# Patient Record
Sex: Female | Born: 1945 | Race: White | Hispanic: No | Smoking: Never smoker
Health system: Southern US, Community
[De-identification: ages and names within clinical notes are randomized; demographics above are authoritative.]

## PROBLEM LIST (undated history)

## (undated) DIAGNOSIS — E7800 Pure hypercholesterolemia, unspecified: Secondary | ICD-10-CM

## (undated) DIAGNOSIS — J45909 Unspecified asthma, uncomplicated: Secondary | ICD-10-CM

## (undated) HISTORY — PX: TONSILLECTOMY: SUR1361

## (undated) HISTORY — PX: APPENDECTOMY: SHX54

---

## 2016-05-15 ENCOUNTER — Emergency Department (HOSPITAL_COMMUNITY): Payer: Medicare (Managed Care)

## 2016-05-15 ENCOUNTER — Encounter (HOSPITAL_COMMUNITY): Payer: Self-pay | Admitting: Emergency Medicine

## 2016-05-15 ENCOUNTER — Observation Stay (HOSPITAL_COMMUNITY)
Admission: EM | Admit: 2016-05-15 | Discharge: 2016-05-16 | Disposition: A | Discharge status: Home / Self Care | Payer: Medicare (Managed Care) | Attending: Cardiology | Admitting: Cardiology

## 2016-05-15 DIAGNOSIS — I252 Old myocardial infarction: Secondary | ICD-10-CM | POA: Insufficient documentation

## 2016-05-15 DIAGNOSIS — I200 Unstable angina: Secondary | ICD-10-CM | POA: Diagnosis present

## 2016-05-15 DIAGNOSIS — Z8673 Personal history of transient ischemic attack (TIA), and cerebral infarction without residual deficits: Secondary | ICD-10-CM | POA: Diagnosis not present

## 2016-05-15 DIAGNOSIS — K219 Gastro-esophageal reflux disease without esophagitis: Secondary | ICD-10-CM | POA: Diagnosis not present

## 2016-05-15 DIAGNOSIS — I25110 Atherosclerotic heart disease of native coronary artery with unstable angina pectoris: Secondary | ICD-10-CM | POA: Insufficient documentation

## 2016-05-15 DIAGNOSIS — I10 Essential (primary) hypertension: Secondary | ICD-10-CM | POA: Insufficient documentation

## 2016-05-15 DIAGNOSIS — Z8249 Family history of ischemic heart disease and other diseases of the circulatory system: Secondary | ICD-10-CM | POA: Insufficient documentation

## 2016-05-15 DIAGNOSIS — E785 Hyperlipidemia, unspecified: Secondary | ICD-10-CM | POA: Insufficient documentation

## 2016-05-15 DIAGNOSIS — R030 Elevated blood-pressure reading, without diagnosis of hypertension: Secondary | ICD-10-CM | POA: Diagnosis present

## 2016-05-15 DIAGNOSIS — J449 Chronic obstructive pulmonary disease, unspecified: Secondary | ICD-10-CM | POA: Diagnosis not present

## 2016-05-15 DIAGNOSIS — J4530 Mild persistent asthma, uncomplicated: Principal | ICD-10-CM | POA: Diagnosis present

## 2016-05-15 DIAGNOSIS — Z7982 Long term (current) use of aspirin: Secondary | ICD-10-CM | POA: Insufficient documentation

## 2016-05-15 DIAGNOSIS — G4733 Obstructive sleep apnea (adult) (pediatric): Secondary | ICD-10-CM | POA: Insufficient documentation

## 2016-05-15 DIAGNOSIS — R079 Chest pain, unspecified: Secondary | ICD-10-CM

## 2016-05-15 HISTORY — DX: Pure hypercholesterolemia, unspecified: E78.00

## 2016-05-15 HISTORY — DX: Unspecified asthma, uncomplicated: J45.909

## 2016-05-15 LAB — COMPREHENSIVE METABOLIC PANEL
ALBUMIN: 3.9 g/dL (ref 3.5–5.0)
ALT: 16 U/L (ref 14–54)
AST: 20 U/L (ref 15–41)
Alkaline Phosphatase: 50 U/L (ref 38–126)
Anion gap: 10 (ref 5–15)
BUN: 20 mg/dL (ref 6–20)
CHLORIDE: 107 mmol/L (ref 101–111)
CO2: 24 mmol/L (ref 22–32)
Calcium: 9.4 mg/dL (ref 8.9–10.3)
Creatinine, Ser: 0.82 mg/dL (ref 0.44–1.00)
GFR calc Af Amer: >60 mL/min (ref >60)
GLUCOSE: 112 mg/dL — AB (ref 65–99)
POTASSIUM: 3.4 mmol/L — AB (ref 3.5–5.1)
SODIUM: 141 mmol/L (ref 135–145)
Total Bilirubin: 0.7 mg/dL (ref 0.3–1.2)
Total Protein: 6.7 g/dL (ref 6.5–8.1)

## 2016-05-15 LAB — CBC WITH DIFFERENTIAL/PLATELET
BASOS ABS: 0 10*3/uL (ref 0.0–0.1)
BASOS PCT: 0 %
EOS ABS: 0.5 10*3/uL (ref 0.0–0.7)
EOS PCT: 6 %
HCT: 37.8 % (ref 36.0–46.0)
Hemoglobin: 12.5 g/dL (ref 12.0–15.0)
Lymphocytes Relative: 29 %
Lymphs Abs: 2.4 10*3/uL (ref 0.7–4.0)
MCH: 30.1 pg (ref 26.0–34.0)
MCHC: 33.1 g/dL (ref 30.0–36.0)
MCV: 91.1 fL (ref 78.0–100.0)
MONO ABS: 0.3 10*3/uL (ref 0.1–1.0)
Monocytes Relative: 4 %
Neutro Abs: 5.1 10*3/uL (ref 1.7–7.7)
Neutrophils Relative %: 61 %
PLATELETS: 298 10*3/uL (ref 150–400)
RBC: 4.15 MIL/uL (ref 3.87–5.11)
RDW: 13.1 % (ref 11.5–15.5)
WBC: 8.3 10*3/uL (ref 4.0–10.5)

## 2016-05-15 LAB — I-STAT TROPONIN, ED: TROPONIN I, POC: 0 ng/mL (ref 0.00–0.08)

## 2016-05-15 MED ORDER — ASPIRIN 325 MG PO TABS
325.0000 mg | ORAL_TABLET | Freq: Once | ORAL | Status: AC
  Administered 2016-05-16: 325 mg via ORAL
  Filled 2016-05-15: qty 1

## 2016-05-15 NOTE — ED Triage Notes (Signed)
Pt c/o right sided chest pressure off and on x's 2 weeks.  Also st's she feels short of breath.  Pt st's she has a hx of asthma

## 2016-05-15 NOTE — ED Provider Notes (Signed)
[facility]-EMERGENCY DEPT Provider Note   CSN: [id number] Arrival date & time: 05/15/16  1916  By signing my name below, I, Arianna Nassar, attest that this documentation has been prepared under the direction and in the presence of Azalia Bilis, MD.  Electronically Signed: Octavia Heir, ED Scribe. 05/16/16. 12:02 AM.    History   Chief Complaint Chief Complaint  Patient presents with  . Chest Pain   The history is provided by the patient. No language interpreter was used.   HPI Comments: Stephanie Hanna is a 70 y.o. female who has a PMhx of high cholesterol and asthma presents to the Emergency Department complaining of intermittent, gradual worsening, moderate right sided chest pressure with associated palpitations upon exertion onset three nights ago. She further notes having issues with her asthma. Pt says that she was exercising for ~ 10 minutes and notes that she felt sudden chest pressure and notes it was hard to catch her breath. She reports being seen for the same symptoms ~ ten months ago where she was able to exercise with no problem. Pt says she has taken her nebulizer inhaler to alleviate her symptoms with minimal relief for one night but has not caused relief since. She notes her chest pain modifies with rest. Pt mentions about 4 months ago, she began to feel slightly short of breath when going up the stairs which is abnormal. She notes calling her PCP in [place] and was told to come and be evaluated in the ED. Pt notes having 4% plaque on her heart. Pt has a paternal hx of MI x 4 in his 25's, CAD, CVA, COPD, emphysema, asthma, and HTN due to being a smoker. Pt has had one stress test last year which was normal. Pt is a non-smoker. She denies hx of DM, HTN, or heart catheterization.      Past Medical History:  Diagnosis Date  . Asthma   . High cholesterol     There are no active problems to display for this patient.   Past Surgical History:  Procedure Laterality Date    . APPENDECTOMY    . TONSILLECTOMY      OB History    No data available       Home Medications    Prior to Admission medications   Not on File    Family History No family history on file.  Social History Social History  Substance Use Topics  . Smoking status: Never Smoker  . Smokeless tobacco: Never Used  . Alcohol use No     Allergies   Review of patient's allergies indicates not on file.   Review of Systems Review of Systems  A complete 10 system review of systems was obtained and all systems are negative except as noted in the HPI and PMH.   Physical Exam Updated Vital Signs BP 108/67   Pulse 67   Temp 97.6 F (36.4 C) (Oral)   Resp 16   Ht 5\' 2"  (1.575 m)   Wt 120 lb (54.4 kg)   SpO2 99%   BMI 21.95 kg/m   Physical Exam  Constitutional: She is oriented to person, place, and time. She appears well-developed and well-nourished. No distress.  HENT:  Head: Normocephalic and atraumatic.  Eyes: EOM are normal.  Neck: Normal range of motion.  Cardiovascular: Normal rate, regular rhythm and normal heart sounds.   Pulmonary/Chest: Effort normal and breath sounds normal.  Abdominal: Soft. She exhibits no distension. There is no tenderness.  Musculoskeletal: Normal  range of motion.  Neurological: She is alert and oriented to person, place, and time.  Skin: Skin is warm and dry.  Psychiatric: She has a normal mood and affect. Judgment normal.  Nursing note and vitals reviewed.    ED Treatments / Results  DIAGNOSTIC STUDIES: Oxygen Saturation is 99% on RA, normal by my interpretation.  COORDINATION OF CARE:  11:59 PM Discussed treatment with pt at bedside and pt agreed to plan.  Labs (all labs ordered are listed, but only abnormal results are displayed) Labs Reviewed  COMPREHENSIVE METABOLIC PANEL - Abnormal; Notable for the following:       Result Value   Potassium 3.4 (*)    Glucose, Bld 112 (*)    All other components within normal limits   CBC WITH DIFFERENTIAL/PLATELET  TROPONIN I  BRAIN NATRIURETIC PEPTIDE  TSH  TROPONIN I  COMPREHENSIVE METABOLIC PANEL  CBC  PROTIME-INR  HEPARIN LEVEL (UNFRACTIONATED)  TROPONIN I  TROPONIN I  HEMOGLOBIN A1C  I-STAT TROPOININ, ED    EKG  EKG Interpretation  Date/Time:  Friday May 15 2016 19:48:35 EDT Ventricular Rate:  81 PR Interval:  198 QRS Duration: 82 QT Interval:  386 QTC Calculation: 448 R Axis:   -103 Text Interpretation:  Normal sinus rhythm Right superior axis deviation Right ventricular hypertrophy Abnormal ECG No significant change was found Confirmed by Briah Nary  MD, Suliman Termini ([id number]) on 05/15/2016 11:06:05 PM       Radiology Dg Chest 2 View  Result Date: 05/15/2016 CLINICAL DATA:  Chest pressure EXAM: CHEST  2 VIEW COMPARISON:  None. FINDINGS: The heart size and mediastinal contours are within normal limits. Both lungs are clear. The visualized skeletal structures are unremarkable. IMPRESSION: No active cardiopulmonary disease. Electronically Signed   By: Natasha MeadLiviu  Pop M.D.   On: 05/15/2016 20:25    Procedures Procedures (including critical care time)  Medications Ordered in ED Medications  cromolyn (OPTICROM) 4 % ophthalmic solution 1 drop (not administered)  levothyroxine (SYNTHROID, LEVOTHROID) tablet 50 mcg (not administered)  albuterol (PROVENTIL) (2.5 MG/3ML) 0.083% nebulizer solution 2.5 mg (not administered)  loratadine (CLARITIN) tablet 10 mg (not administered)  ipratropium (ATROVENT) 0.06 % nasal spray 2 spray (not administered)  cholecalciferol (VITAMIN D) tablet 4,000 Units (not administered)  calcium carbonate (TUMS - dosed in mg elemental calcium) chewable tablet 400 mg of elemental calcium (not administered)  montelukast (SINGULAIR) tablet 10 mg (not administered)  sodium chloride flush (NS) 0.9 % injection 3 mL (3 mLs Intravenous Given 05/16/16 0331)  aspirin EC tablet 81 mg (not administered)  heparin ADULT infusion 100 units/mL (25000  units/24250mL sodium chloride 0.45%) (650 Units/hr Intravenous New Bag/Given 05/16/16 0326)  metoprolol succinate (TOPROL-XL) 24 hr tablet 25 mg (not administered)  aspirin tablet 325 mg (325 mg Oral Given 05/16/16 0024)  heparin bolus via infusion 2,000 Units (2,000 Units Intravenous Bolus from Bag 05/16/16 0330)     Initial Impression / Assessment and Plan / ED Course  I have reviewed the triage vital signs and the nursing notes.  Pertinent labs & imaging results that were available during my care of the patient were reviewed by me and considered in my medical decision making (see chart for details).  Clinical Course    Start concerning for unstable angina with no exertional chest discomfort and chest pressure.  Cardiology consultation for admission the hospital.  No active chest pain at this time.  EKG and troponin are without abnormality  Final Clinical Impressions(s) / ED Diagnoses   Final diagnoses:  None   I personally performed the services described in this documentation, which was scribed in my presence. The recorded information has been reviewed and is accurate.     New Prescriptions New Prescriptions   No medications on file     Azalia Bilis, MD 05/16/16 [phone]

## 2016-05-16 ENCOUNTER — Other Ambulatory Visit: Payer: Self-pay | Admitting: Student

## 2016-05-16 DIAGNOSIS — R079 Chest pain, unspecified: Secondary | ICD-10-CM

## 2016-05-16 DIAGNOSIS — J4530 Mild persistent asthma, uncomplicated: Secondary | ICD-10-CM | POA: Diagnosis not present

## 2016-05-16 DIAGNOSIS — I25110 Atherosclerotic heart disease of native coronary artery with unstable angina pectoris: Secondary | ICD-10-CM | POA: Diagnosis not present

## 2016-05-16 DIAGNOSIS — R030 Elevated blood-pressure reading, without diagnosis of hypertension: Secondary | ICD-10-CM | POA: Diagnosis present

## 2016-05-16 DIAGNOSIS — R0609 Other forms of dyspnea: Secondary | ICD-10-CM

## 2016-05-16 DIAGNOSIS — I200 Unstable angina: Secondary | ICD-10-CM

## 2016-05-16 DIAGNOSIS — K219 Gastro-esophageal reflux disease without esophagitis: Secondary | ICD-10-CM | POA: Diagnosis not present

## 2016-05-16 DIAGNOSIS — G4733 Obstructive sleep apnea (adult) (pediatric): Secondary | ICD-10-CM | POA: Diagnosis not present

## 2016-05-16 LAB — CBC
HCT: 39.7 % (ref 36.0–46.0)
HEMOGLOBIN: 12.8 g/dL (ref 12.0–15.0)
MCH: 29.8 pg (ref 26.0–34.0)
MCHC: 32.2 g/dL (ref 30.0–36.0)
MCV: 92.5 fL (ref 78.0–100.0)
Platelets: 287 10*3/uL (ref 150–400)
RBC: 4.29 MIL/uL (ref 3.87–5.11)
RDW: 13 % (ref 11.5–15.5)
WBC: 8.7 10*3/uL (ref 4.0–10.5)

## 2016-05-16 LAB — COMPREHENSIVE METABOLIC PANEL
ALBUMIN: 3.9 g/dL (ref 3.5–5.0)
ALT: 16 U/L (ref 14–54)
ANION GAP: 7 (ref 5–15)
AST: 18 U/L (ref 15–41)
Alkaline Phosphatase: 52 U/L (ref 38–126)
BUN: 20 mg/dL (ref 6–20)
CO2: 28 mmol/L (ref 22–32)
Calcium: 9.2 mg/dL (ref 8.9–10.3)
Chloride: 106 mmol/L (ref 101–111)
Creatinine, Ser: 0.69 mg/dL (ref 0.44–1.00)
GFR calc non Af Amer: >60 mL/min (ref >60)
GLUCOSE: 98 mg/dL (ref 65–99)
POTASSIUM: 3.5 mmol/L (ref 3.5–5.1)
SODIUM: 141 mmol/L (ref 135–145)
Total Bilirubin: 0.5 mg/dL (ref 0.3–1.2)
Total Protein: 7 g/dL (ref 6.5–8.1)

## 2016-05-16 LAB — TROPONIN I
Troponin I: <0.03 ng/mL (ref <0.03)
Troponin I: <0.03 ng/mL (ref <0.03)

## 2016-05-16 LAB — PROTIME-INR
INR: 0.91
Prothrombin Time: 12.3 seconds (ref 11.4–15.2)

## 2016-05-16 LAB — GLUCOSE, CAPILLARY: Glucose-Capillary: 103 mg/dL — ABNORMAL HIGH (ref 65–99)

## 2016-05-16 LAB — TSH: TSH: 2.717 u[IU]/mL (ref 0.350–4.500)

## 2016-05-16 LAB — BRAIN NATRIURETIC PEPTIDE: B Natriuretic Peptide: 26.5 pg/mL (ref 0.0–100.0)

## 2016-05-16 LAB — HEPARIN LEVEL (UNFRACTIONATED): Heparin Unfractionated: 0.56 IU/mL (ref 0.30–0.70)

## 2016-05-16 MED ORDER — VITAMIN D 1000 UNITS PO TABS
4000.0000 [IU] | ORAL_TABLET | Freq: Every day | ORAL | Status: DC
  Administered 2016-05-16: 4000 [IU] via ORAL
  Filled 2016-05-16: qty 4

## 2016-05-16 MED ORDER — CROMOLYN SODIUM 4 % OP SOLN
1.0000 [drp] | Freq: Four times a day (QID) | OPHTHALMIC | Status: DC
  Administered 2016-05-16 (×2): 1 [drp] via OPHTHALMIC
  Filled 2016-05-16: qty 10

## 2016-05-16 MED ORDER — LORATADINE 10 MG PO TABS
10.0000 mg | ORAL_TABLET | Freq: Every day | ORAL | Status: DC
  Administered 2016-05-16: 10 mg via ORAL
  Filled 2016-05-16: qty 1

## 2016-05-16 MED ORDER — ALBUTEROL SULFATE (2.5 MG/3ML) 0.083% IN NEBU: 2.5000 mg | INHALATION_SOLUTION | Freq: Four times a day (QID) | RESPIRATORY_TRACT | Status: DC | PRN

## 2016-05-16 MED ORDER — ASPIRIN EC 81 MG PO TBEC
81.0000 mg | DELAYED_RELEASE_TABLET | Freq: Every day | ORAL | Status: DC
  Administered 2016-05-16: 81 mg via ORAL
  Filled 2016-05-16: qty 1

## 2016-05-16 MED ORDER — ASPIRIN 81 MG PO TBEC: 81.0000 mg | DELAYED_RELEASE_TABLET | Freq: Every day | ORAL | Status: AC

## 2016-05-16 MED ORDER — HEPARIN BOLUS VIA INFUSION
2000.0000 [IU] | Freq: Once | INTRAVENOUS | Status: AC
  Administered 2016-05-16: 2000 [IU] via INTRAVENOUS
  Filled 2016-05-16: qty 2000

## 2016-05-16 MED ORDER — ALBUTEROL SULFATE HFA 108 (90 BASE) MCG/ACT IN AERS: 1.0000 | INHALATION_SPRAY | Freq: Four times a day (QID) | RESPIRATORY_TRACT | Status: DC | PRN

## 2016-05-16 MED ORDER — LEVOTHYROXINE SODIUM 50 MCG PO TABS: 50.0000 ug | ORAL_TABLET | Freq: Every day | ORAL | Status: DC

## 2016-05-16 MED ORDER — METOPROLOL SUCCINATE ER 25 MG PO TB24
25.0000 mg | ORAL_TABLET | Freq: Every day | ORAL | Status: DC
  Filled 2016-05-16: qty 1

## 2016-05-16 MED ORDER — SODIUM CHLORIDE 0.9% FLUSH
3.0000 mL | Freq: Two times a day (BID) | INTRAVENOUS | Status: DC
  Administered 2016-05-16: 3 mL via INTRAVENOUS

## 2016-05-16 MED ORDER — MONTELUKAST SODIUM 10 MG PO TABS: 10.0000 mg | ORAL_TABLET | Freq: Every day | ORAL | Status: DC

## 2016-05-16 MED ORDER — IPRATROPIUM BROMIDE 0.06 % NA SOLN
2.0000 | Freq: Three times a day (TID) | NASAL | Status: DC
  Administered 2016-05-16: 2 via NASAL
  Filled 2016-05-16: qty 30

## 2016-05-16 MED ORDER — CALCIUM CARBONATE ANTACID 500 MG PO CHEW
2.0000 | CHEWABLE_TABLET | Freq: Every day | ORAL | Status: DC
  Administered 2016-05-16: 400 mg via ORAL
  Filled 2016-05-16: qty 2

## 2016-05-16 MED ORDER — HEPARIN (PORCINE) IN NACL 100-0.45 UNIT/ML-% IJ SOLN
650.0000 [IU]/h | INTRAMUSCULAR | Status: DC
  Administered 2016-05-16: 650 [IU]/h via INTRAVENOUS
  Filled 2016-05-16 (×2): qty 250

## 2016-05-16 NOTE — Progress Notes (Signed)
Patient ID: Myeasha Ballowe, female   DOB: [birth year], 70 y.o.   MRN: [id number]    Patient Name: Stephanie Hanna Date of Encounter: 05/16/2016     Principal Problem:   Mild persistent asthma Active Problems:   Unstable angina (HCC)   Prehypertension   GERD (gastroesophageal reflux disease)    SUBJECTIVE  No chest pressure or sob. Ruled out for MI. ECG is normal.  CURRENT MEDS . aspirin EC  81 mg Oral Daily  . calcium carbonate  2 tablet Oral Daily  . cholecalciferol  4,000 Units Oral Daily  . cromolyn  1 drop Both Eyes QID  . ipratropium  2 spray Each Nare TID  . levothyroxine  50 mcg Oral QAC breakfast  . loratadine  10 mg Oral Daily  . metoprolol succinate  25 mg Oral Daily  . montelukast  10 mg Oral QHS  . sodium chloride flush  3 mL Intravenous Q12H    OBJECTIVE  Vitals:   05/16/16 0130 05/16/16 0312 05/16/16 1000 05/16/16 1100  BP: 124/57 135/67 (!) 99/47 (!) 99/47  Pulse: (!) 57 63 75 75  Resp: 12 18    Temp:  97.6 F (36.4 C)    TempSrc:  Oral    SpO2: 100%     Weight:  120 lb 4.8 oz (54.6 kg)    Height:  5\' 2"  (1.575 m)      Intake/Output Summary (Last 24 hours) at 05/16/16 1120 Last data filed at 05/16/16 0800  Gross per 24 hour  Intake              240 ml  Output                0 ml  Net              240 ml   Filed Weights   05/15/16 1949 05/16/16 0312  Weight: 120 lb (54.4 kg) 120 lb 4.8 oz (54.6 kg)    PHYSICAL EXAM  General: Pleasant, NAD. Neuro: Alert and oriented X 3. Moves all extremities spontaneously. Psych: Normal affect. HEENT:  Normal  Neck: Supple without bruits or JVD. Lungs:  Resp regular and unlabored, CTA. Heart: RRR no s3, s4, or murmurs. Abdomen: Soft, non-tender, non-distended, BS + x 4.  Extremities: No clubbing, cyanosis or edema. DP/PT/Radials 2+ and equal bilaterally.  Accessory Clinical Findings  CBC  Recent Labs  05/15/16 1953 05/16/16 0320  WBC 8.3 8.7  NEUTROABS 5.1  --   HGB 12.5 12.8  HCT  37.8 39.7  MCV 91.1 92.5  PLT 298 287   Basic Metabolic Panel  Recent Labs  05/15/16 1953 05/16/16 0320  NA 141 141  K 3.4* 3.5  CL 107 106  CO2 24 28  GLUCOSE 112* 98  BUN 20 20  CREATININE 0.82 0.69  CALCIUM 9.4 9.2   Liver Function Tests  Recent Labs  05/15/16 1953 05/16/16 0320  AST 20 18  ALT 16 16  ALKPHOS 50 52  BILITOT 0.7 0.5  PROT 6.7 7.0  ALBUMIN 3.9 3.9   No results for input(s): LIPASE, AMYLASE in the last 72 hours. Cardiac Enzymes  Recent Labs  05/16/16 0125 05/16/16 0320  TROPONINI <0.03 <0.03   BNP Invalid input(s): POCBNP D-Dimer No results for input(s): DDIMER in the last 72 hours. Hemoglobin A1C No results for input(s): HGBA1C in the last 72 hours. Fasting Lipid Panel No results for input(s): CHOL, HDL, LDLCALC, TRIG, CHOLHDL, LDLDIRECT in the last 72 hours.  Thyroid Function Tests  Recent Labs  05/16/16 0325  TSH 2.717    TELE  nsr  ECG  nsr  Radiology/Studies  Dg Chest 2 View  Result Date: 05/15/2016 CLINICAL DATA:  Chest pressure EXAM: CHEST  2 VIEW COMPARISON:  None. FINDINGS: The heart size and mediastinal contours are within normal limits. Both lungs are clear. The visualized skeletal structures are unremarkable. IMPRESSION: No active cardiopulmonary disease. Electronically Signed   By: Natasha MeadLiviu  Pop M.D.   On: 05/15/2016 20:25    ASSESSMENT AND PLAN  1. Chest pressure - she is stable today. Her symptoms are more sob with exertion. I would suggest she be allowed to be discharged home and come back for an outpatient stess myoview. She has known coronary calcium by CT scanning in [place]. 2. Sob - she notes this with exertion. She admits to being sedentary.  3. Asthma - she will continue her bronchodilators. She is note wheezing on exam this morning.  Gregg Taylor,M.D.  05/16/2016 11:20 AM

## 2016-05-16 NOTE — H&P (Signed)
CARDIOLOGY INPATIENT HISTORY AND PHYSICAL EXAMINATION NOTE  Patient ID: Stephanie Hanna MRN: [id number], DOB/AGE: 1946-03-14   Admit date: 05/15/2016   Primary Physician: Pcp Not In System Primary Cardiologist: none  Reason for admission: chest pain  HPI: This is a 70 y.o. WF with no prior history of CAD but has h/o OSA uses mouth guard, laryngostrangulation, GERD, asthma who presented with chest pain.   CP is pressure like, heavy in character, located in right chest. She recently started atorvastatin 6 mo ago. Symptoms started 2 mo ago. Usually occur with exercise. Improve with rest. Symptoms are similar ot her asthma attack but were much worse and are occurring more frequently now. Exertional symptoms usually occur after 30 minutes of exercise. She also had nocturnal angina. Family history significant for CAD. Today CP improved with NTG when she arrived to the ED.   Problem List: Past Medical History:  Diagnosis Date  . Asthma   . High cholesterol     Past Surgical History:  Procedure Laterality Date  . APPENDECTOMY    . TONSILLECTOMY       Allergies: No Known Allergies   Home Medications Current Facility-Administered Medications  Medication Dose Route Frequency Provider Last Rate Last Dose  . albuterol (PROVENTIL) (2.5 MG/3ML) 0.083% nebulizer solution 2.5 mg  2.5 mg Nebulization Q6H PRN Joellyn Rued, MD      . aspirin EC tablet 81 mg  81 mg Oral Daily Joellyn Rued, MD      . calcium carbonate (TUMS - dosed in mg elemental calcium) chewable tablet 400 mg of elemental calcium  2 tablet Oral Daily Joellyn Rued, MD      . cholecalciferol (VITAMIN D) tablet 4,000 Units  4,000 Units Oral Daily Joellyn Rued, MD      . cromolyn (OPTICROM) 4 % ophthalmic solution 1 drop  1 drop Both Eyes QID Joellyn Rued, MD      . heparin ADULT infusion 100 units/mL (25000 units/261mL sodium chloride 0.45%)  650 Units/hr Intravenous Continuous Juliette Mangle, RPH 6.5 mL/hr at  05/16/16 0326 650 Units/hr at 05/16/16 0326  . ipratropium (ATROVENT) 0.06 % nasal spray 2 spray  2 spray Each Nare TID Joellyn Rued, MD      . levothyroxine (SYNTHROID, LEVOTHROID) tablet 50 mcg  50 mcg Oral QAC breakfast Joellyn Rued, MD      . loratadine (CLARITIN) tablet 10 mg  10 mg Oral Daily Joellyn Rued, MD      . montelukast (SINGULAIR) tablet 10 mg  10 mg Oral QHS Joellyn Rued, MD      . sodium chloride flush (NS) 0.9 % injection 3 mL  3 mL Intravenous Q12H Joellyn Rued, MD   3 mL at 05/16/16 0331     No family history on file.   Social History   Social History  . Marital status: Widowed    Spouse name: N/A  . Number of children: N/A  . Years of education: N/A   Occupational History  . Not on file.   Social History Main Topics  . Smoking status: Never Smoker  . Smokeless tobacco: Never Used  . Alcohol use No  . Drug use: No  . Sexual activity: Not on file   Other Topics Concern  . Not on file   Social History Narrative  . No narrative on file     Review of Systems: General: negative for chills, fever, night sweats or weight changes.  Cardiovascular: chest pain, dyspnea negative for dyspnea on exertion, edema, orthopnea, palpitations, paroxysmal nocturnal dyspnea  Dermatological: negative for rash Respiratory: negative for cough or wheezing Urologic: negative for hematuria Abdominal: negative for nausea, vomiting, diarrhea, bright red blood per rectum, melena, or hematemesis Neurologic: negative for visual changes, syncope, or dizziness Endocrine: no diabetes, no hypothyroidism Immunological: no lymph adenopathy Psych: non homicidal/suicidal  Physical Exam: Vitals: BP 135/67 (BP Location: Right Arm)   Pulse 63   Temp 97.6 F (36.4 C) (Oral)   Resp 18   Ht 5\' 2"  (1.575 m)   Wt 54.6 kg (120 lb 4.8 oz)   SpO2 100%   BMI 22.00 kg/m  General: not in acute distress Neck: JVP flat, neck supple Heart: regular rate and rhythm, S1, S2, no  murmurs  Lungs: CTAB  GI: non tender, non distended, bowel sounds present Extremities: no edema Neuro: AAO x 3  Psych: normal affect, no anxiety   Labs:   Results for orders placed or performed during the hospital encounter of 05/15/16 (from the past 24 hour(s))  CBC with Differential     Status: None   Collection Time: 05/15/16  7:53 PM  Result Value Ref Range   WBC 8.3 4.0 - 10.5 K/uL   RBC 4.15 3.87 - 5.11 MIL/uL   Hemoglobin 12.5 12.0 - 15.0 g/dL   HCT 16.1 09.6 - 04.5 %   MCV 91.1 78.0 - 100.0 fL   MCH 30.1 26.0 - 34.0 pg   MCHC 33.1 30.0 - 36.0 g/dL   RDW 40.9 81.1 - 91.4 %   Platelets 298 150 - 400 K/uL   Neutrophils Relative % 61 %   Neutro Abs 5.1 1.7 - 7.7 K/uL   Lymphocytes Relative 29 %   Lymphs Abs 2.4 0.7 - 4.0 K/uL   Monocytes Relative 4 %   Monocytes Absolute 0.3 0.1 - 1.0 K/uL   Eosinophils Relative 6 %   Eosinophils Absolute 0.5 0.0 - 0.7 K/uL   Basophils Relative 0 %   Basophils Absolute 0.0 0.0 - 0.1 K/uL  Comprehensive metabolic panel     Status: Abnormal   Collection Time: 05/15/16  7:53 PM  Result Value Ref Range   Sodium 141 135 - 145 mmol/L   Potassium 3.4 (L) 3.5 - 5.1 mmol/L   Chloride 107 101 - 111 mmol/L   CO2 24 22 - 32 mmol/L   Glucose, Bld 112 (H) 65 - 99 mg/dL   BUN 20 6 - 20 mg/dL   Creatinine, Ser 7.82 0.44 - 1.00 mg/dL   Calcium 9.4 8.9 - 95.6 mg/dL   Total Protein 6.7 6.5 - 8.1 g/dL   Albumin 3.9 3.5 - 5.0 g/dL   AST 20 15 - 41 U/L   ALT 16 14 - 54 U/L   Alkaline Phosphatase 50 38 - 126 U/L   Total Bilirubin 0.7 0.3 - 1.2 mg/dL   GFR calc non Af Amer >60 >60 mL/min   GFR calc Af Amer >60 >60 mL/min   Anion gap 10 5 - 15  I-Stat Troponin, ED (not at [facility])     Status: None   Collection Time: 05/15/16  7:59 PM  Result Value Ref Range   Troponin i, poc 0.00 0.00 - 0.08 ng/mL   Comment 3          Troponin I     Status: None   Collection Time: 05/16/16  1:25 AM  Result Value Ref Range   Troponin I <0.03 <0.03 ng/mL  Troponin  I     Status: None   Collection Time: 05/16/16  3:20 AM  Result Value Ref Range   Troponin I <0.03 <0.03 ng/mL  Comprehensive metabolic panel     Status: None   Collection Time: 05/16/16  3:20 AM  Result Value Ref Range   Sodium 141 135 - 145 mmol/L   Potassium 3.5 3.5 - 5.1 mmol/L   Chloride 106 101 - 111 mmol/L   CO2 28 22 - 32 mmol/L   Glucose, Bld 98 65 - 99 mg/dL   BUN 20 6 - 20 mg/dL   Creatinine, Ser 1.61 0.44 - 1.00 mg/dL   Calcium 9.2 8.9 - 09.6 mg/dL   Total Protein 7.0 6.5 - 8.1 g/dL   Albumin 3.9 3.5 - 5.0 g/dL   AST 18 15 - 41 U/L   ALT 16 14 - 54 U/L   Alkaline Phosphatase 52 38 - 126 U/L   Total Bilirubin 0.5 0.3 - 1.2 mg/dL   GFR calc non Af Amer >60 >60 mL/min   GFR calc Af Amer >60 >60 mL/min   Anion gap 7 5 - 15  CBC     Status: None   Collection Time: 05/16/16  3:20 AM  Result Value Ref Range   WBC 8.7 4.0 - 10.5 K/uL   RBC 4.29 3.87 - 5.11 MIL/uL   Hemoglobin 12.8 12.0 - 15.0 g/dL   HCT 04.5 40.9 - 81.1 %   MCV 92.5 78.0 - 100.0 fL   MCH 29.8 26.0 - 34.0 pg   MCHC 32.2 30.0 - 36.0 g/dL   RDW 91.4 78.2 - 95.6 %   Platelets 287 150 - 400 K/uL  Protime-INR     Status: None   Collection Time: 05/16/16  3:20 AM  Result Value Ref Range   Prothrombin Time 12.3 11.4 - 15.2 seconds   INR 0.91   Brain natriuretic peptide     Status: None   Collection Time: 05/16/16  3:25 AM  Result Value Ref Range   B Natriuretic Peptide 26.5 0.0 - 100.0 pg/mL     Radiology/Studies: Dg Chest 2 View  Result Date: 05/15/2016 CLINICAL DATA:  Chest pressure EXAM: CHEST  2 VIEW COMPARISON:  None. FINDINGS: The heart size and mediastinal contours are within normal limits. Both lungs are clear. The visualized skeletal structures are unremarkable. IMPRESSION: No active cardiopulmonary disease. Electronically Signed   By: Natasha Mead M.D.   On: 05/15/2016 20:25    EKG: today showed normal sinus rhythm, low voltage in limb leads, normal axis, no ST/T wave changes   Echo:  pending  Cardiac cath: not performed  Medical decision making:  Discussed care with the patient Discussed care with the physician on the phone Reviewed labs and imaging personally Reviewed prior records  ASSESSMENT AND PLAN:  This is a 70 y.o. WF w/no significant cardiac risk factors presented with typical anginal chest pain which has been getting worse for the last 2 months and is being admitted for management of unstable angina.    Principal Problem:   Mild persistent asthma Active Problems:   Unstable angina (HCC)   Prehypertension   GERD (gastroesophageal reflux disease)   Unstable angina  increasing episodes of chest pain, recent at rest pain  - admit to telemetry floor  - recommend IV heparin - cycle troponin - echocardiogram in the AM - NPO post midnight - CBC, CMP, INR/PTT - TSH, HbA1c, lipid panel - consider cardiac cath  - started on  beta blocker as anti-anginal therapy, if worsens asthma, can consider starting on CCB   Mild persistent Asthma Continue home medications  Prehypertension Counseled about DASH diet and life style changes  GERD Takes tums   Signed, Joellyn RuedQURESHI, Fraser Busche T, MD MS 05/16/2016, 5:35 AM

## 2016-05-16 NOTE — Discharge Summary (Signed)
Discharge Summary    Patient ID: Stephanie FavaLoretta Elena Hanna,  MRN: [id number], DOB/AGE: [birth year] 70 y.o.  Admit date: 05/15/2016 Discharge date: 05/16/2016  Primary Care Provider: Pcp Not In System Primary Cardiologist: New - Seen by Dr. Ladona Ridgelaylor  Discharge Diagnoses    Principal Problem:   Mild persistent asthma Active Problems:   Unstable angina (HCC)   Prehypertension   GERD (gastroesophageal reflux disease)   Pain in the chest    History of Present Illness     Stephanie Hanna is a 70 y.o. female with past medical history of HLD, asthma, and GERD who presented to [facility] on 05/15/2016 for evaluation of chest pain.  She reported having a pressure along the right side of her chest, worse since starting Atorvastatin 6 months ago. Reported the symptoms would usually begin with exercise and resolve with rest. Reported the symptoms felt similar to prior asthma attacks.  While in the ED, her WBC was 8.3, Hgb 12.5, and platelets 298. K+ 3.4 and creatinine 0.82. Initial troponin was negative and EKG was without acute ischemic changes. CXR without active cardiopulmonary disease.   She was admitted on the Cardiology service for further observation.  Hospital Course     Consultants: None  The following morning, she denied any repeat episodes of chest discomfort or dyspnea. Cyclic troponin values remained negative overnight and BNP was within normal limits at 26.5. Repeat EKG remained without acute ischemic changes.  She was last examined by Dr. Ladona Ridgelaylor and deemed stable for discharge. An outpatient stress test was recommended as she has known coronary calcifications on prior CT imaging. A staff message has been sent to the office to arrange this. She was discharged home in good condition. _____________  Discharge Vitals Blood pressure (!) 99/47, pulse 75, temperature 97.6 F (36.4 C), temperature source Oral, resp. rate 18, height 5\' 2"  (1.575 m), weight 120 lb 4.8 oz (54.6 kg),  SpO2 100 %.  Filed Weights   05/15/16 1949 05/16/16 0312  Weight: 120 lb (54.4 kg) 120 lb 4.8 oz (54.6 kg)    Labs & Radiologic Studies     CBC  Recent Labs  05/15/16 1953 05/16/16 0320  WBC 8.3 8.7  NEUTROABS 5.1  --   HGB 12.5 12.8  HCT 37.8 39.7  MCV 91.1 92.5  PLT 298 287   Basic Metabolic Panel  Recent Labs  05/15/16 1953 05/16/16 0320  NA 141 141  K 3.4* 3.5  CL 107 106  CO2 24 28  GLUCOSE 112* 98  BUN 20 20  CREATININE 0.82 0.69  CALCIUM 9.4 9.2   Liver Function Tests  Recent Labs  05/15/16 1953 05/16/16 0320  AST 20 18  ALT 16 16  ALKPHOS 50 52  BILITOT 0.7 0.5  PROT 6.7 7.0  ALBUMIN 3.9 3.9   No results for input(s): LIPASE, AMYLASE in the last 72 hours. Cardiac Enzymes  Recent Labs  05/16/16 0125 05/16/16 0320 05/16/16 1029  TROPONINI <0.03 <0.03 <0.03   BNP Invalid input(s): POCBNP D-Dimer No results for input(s): DDIMER in the last 72 hours. Hemoglobin A1C No results for input(s): HGBA1C in the last 72 hours. Fasting Lipid Panel No results for input(s): CHOL, HDL, LDLCALC, TRIG, CHOLHDL, LDLDIRECT in the last 72 hours. Thyroid Function Tests  Recent Labs  05/16/16 0325  TSH 2.717    Dg Chest 2 View  Result Date: 05/15/2016 CLINICAL DATA:  Chest pressure EXAM: CHEST  2 VIEW COMPARISON:  None. FINDINGS: The  heart size and mediastinal contours are within normal limits. Both lungs are clear. The visualized skeletal structures are unremarkable. IMPRESSION: No active cardiopulmonary disease. Electronically Signed   By: Natasha Mead M.D.   On: 05/15/2016 20:25    Diagnostic Studies/Procedures    EKG: NSR, HR 61 with 1st degree AV Block, RAD.  Disposition   Pt is being discharged home today in good condition.  Follow-up Plans & Appointments    Follow-up Information    [facility] Office .   Specialty:  Cardiology Why:  The office will contact you within 3 business days to arrange your cardiac stress test. If  you do not hear from them, please call the number provided. Contact information: [address] [place] [address] [phone]           Discharge Medications     Medication List    TAKE these medications   albuterol 108 (90 Base) MCG/ACT inhaler Commonly known as:  PROVENTIL HFA;VENTOLIN HFA Inhale 1-2 puffs into the lungs every 6 (six) hours as needed for wheezing or shortness of breath.   albuterol (2.5 MG/3ML) 0.083% nebulizer solution Commonly known as:  PROVENTIL Take 2.5 mg by nebulization every 6 (six) hours as needed for wheezing or shortness of breath.   aspirin 81 MG EC tablet Take 1 tablet (81 mg total) by mouth daily.   calcium carbonate 500 MG chewable tablet Commonly known as:  TUMS - dosed in mg elemental calcium Chew 2 tablets by mouth daily.   Cholecalciferol 4000 units Caps Take 4,000 Units by mouth daily.   cromolyn 4 % ophthalmic solution Commonly known as:  OPTICROM Place 1 drop into both eyes 4 (four) times daily.   ipratropium 0.03 % nasal spray Commonly known as:  ATROVENT Place 2 sprays into both nostrils 3 (three) times daily.   levothyroxine 50 MCG tablet Commonly known as:  SYNTHROID, LEVOTHROID Take 50 mcg by mouth daily before breakfast.   loratadine 10 MG tablet Commonly known as:  CLARITIN Take 10 mg by mouth daily.   montelukast 10 MG tablet Commonly known as:  SINGULAIR Take 10 mg by mouth at bedtime.   multivitamin with minerals Tabs tablet Take 1 tablet by mouth daily.   SYSTANE 0.4-0.3 % Soln Generic drug:  Polyethyl Glycol-Propyl Glycol Place 1 drop into both eyes daily.   VITAMIN C PO Take 1 tablet by mouth daily.        Allergies No Known Allergies   Outstanding Labs/Studies   Modified Bruce Protocol Myoview  Duration of Discharge Encounter   Greater than 30 minutes including physician time.  Signed, Ellsworth Lennox, PA-C 05/16/2016, 12:12 PM  Cardiology  Attending  Agree with above. See my note as well.  Leonia Reeves.D.

## 2016-05-16 NOTE — Discharge Instructions (Signed)
You have a Stress Test scheduled at [facility]. Your doctor has ordered this test to get a better idea of how your heart works. ° °Please arrive 15 minutes early for paperwork.  ° °Location: °[address], [address] °[place], [place] [address] °[phone] ° °Instructions: °· No food/drink after midnight the night before.  °· No caffeine/decaf products 24 hours before, including medicines such as Excedrin or Goody Powders. Call if there are any questions.  °· Wear comfortable clothes and shoes.  °· It is OK to take your morning meds with a sip of water EXCEPT for those types of medicines listed below or otherwise instructed. ° °Special Medication Instructions: °· Beta blockers such as metoprolol (Lopressor/Toprol XL), atenolol (Tenormin), carvedilol (Coreg), nebivolol (Bystolic), propranolol (Inderal) should not be taken for 24 hours before the test. °· Calcium channel blockers such as diltiazem (Cardizem) or verapmil (Calan) should not be taken for 24 hours before the test. °· Remove nitroglycerin patches and do not take nitrate preparations such as Imdur/isosorbide the day of your test. °· No Persantine/Theophylline or Aggrenox medicines should be used within 24 hours of the test.  ° °What To Expect: °The whole test will take several hours. When you arrive in the lab, the technician will inject a small amount of radioactive tracer into your arm through an IV while you are resting quietly. This helps us to form pictures of your heart. You will likely only feel a sting from the IV. After a waiting period, resting pictures will be obtained under a big camera. These are the "before" pictures. ° °Next, you will be prepped for the stress portion of the test. This may include either walking on a treadmill or receiving a medicine that helps to dilate blood vessels in your heart to simulate the effect of exercise on your heart. If you are walking on a treadmill, you will walk at different paces to  try to get your heart rate to a goal number that is based on your age. If your doctor has chosen the pharmacologic test, then you will receive a medicine through your IV that may cause temporary nausea, flushing, shortness of breath and sometimes chest discomfort or vomiting. This is typically short-lived and usually resolves quickly. Your blood pressure and heart rate will be monitored, and we will be watching your EKG on a computer screen for any changes. During this portion of the test, the radiologist will inject another small amount of radioactive tracer into your IV. After a waiting period, you will undergo a second set of pictures. These are the "after" pictures. ° °The doctor reading the test will compare the before-and-after images to look for evidence of heart blockages or heart weakness. In certain instances, this test is done over 2 days but usually only takes 1 day to complete. ° °

## 2016-05-16 NOTE — ED Notes (Signed)
Report attempted 

## 2016-05-16 NOTE — Progress Notes (Signed)
ANTICOAGULATION CONSULT NOTE - Initial Consult  Pharmacy Consult for heparin Indication: chest pain/ACS  No Known Allergies  Patient Measurements: Height: 5\' 2"  (157.5 cm) Weight: 120 lb (54.4 kg) IBW/kg (Calculated) : 50.1  Vital Signs: Temp: 97.6 F (36.4 C) (09/08 1944) Temp Source: Oral (09/08 1944) BP: 124/57 (09/09 0130) Pulse Rate: 57 (09/09 0130)  Labs:  Recent Labs  05/15/16 1953  HGB 12.5  HCT 37.8  PLT 298  CREATININE 0.82    Estimated Creatinine Clearance: 50.5 mL/min (by C-G formula based on SCr of 0.82 mg/dL).   Medical History: Past Medical History:  Diagnosis Date  . Asthma   . High cholesterol     Assessment: 70yo female c/o intermittent right-sided chest pressure x2wk and SOB, initial i-stat troponin negative, to begin heparin.  Goal of Therapy:  Heparin level 0.3-0.7 units/ml Monitor platelets by anticoagulation protocol: Yes   Plan:  Will give heparin 2000 units IV bolus x1 followed by gtt at 650 units/hr and monitor heparin levels and CBC.  Vernard GamblesVeronda Liliana Dang, PharmD, BCPS  05/16/2016,2:17 AM

## 2016-05-16 NOTE — Progress Notes (Signed)
Pt has been discharged  home with family. IV was removed with no complications. Telemetry box was removed. Pt received discharge instructions and all questions were answered. Pt left the unit via wheelchair and was accompanied by this RN. Pt left with all of her belongings. Pt was in no distress at time of discharge.   Berdine DanceLauren Moffitt BSN, RN

## 2016-05-16 NOTE — ED Notes (Signed)
Pt ambulated to bathroom 

## 2016-05-17 LAB — HEMOGLOBIN A1C
HEMOGLOBIN A1C: 5.5 % (ref 4.8–5.6)
Mean Plasma Glucose: 111 mg/dL

## 2016-05-21 ENCOUNTER — Telehealth (HOSPITAL_COMMUNITY): Payer: Self-pay | Admitting: *Deleted

## 2016-05-21 NOTE — Telephone Encounter (Signed)
Attempted to call patient regarding upcoming appointment - busy x 2.   Stephanie Hanna  

## 2016-05-25 ENCOUNTER — Telehealth (HOSPITAL_COMMUNITY): Payer: Self-pay | Admitting: *Deleted

## 2016-05-25 NOTE — Telephone Encounter (Signed)
Patient given detailed instructions per Myocardial Perfusion Study Information Sheet for the test on 05/26/16 at 12:00. Patient notified to arrive 15 minutes early and that it is imperative to arrive on time for appointment to keep from having the test rescheduled.  If you need to cancel or reschedule your appointment, please call the office within 24 hours of your appointment. Failure to do so may result in a cancellation of your appointment, and a $50 no show fee. Patient verbalized understanding.Daneil DolinSharon S Brooks

## 2016-05-26 ENCOUNTER — Ambulatory Visit (HOSPITAL_COMMUNITY): Payer: Medicare (Managed Care) | Attending: Cardiology

## 2016-05-26 DIAGNOSIS — R079 Chest pain, unspecified: Secondary | ICD-10-CM | POA: Insufficient documentation

## 2016-05-26 DIAGNOSIS — R0609 Other forms of dyspnea: Secondary | ICD-10-CM | POA: Diagnosis not present

## 2016-05-26 LAB — MYOCARDIAL PERFUSION IMAGING
CHL CUP MPHR: 149 {beats}/min
CHL CUP NUCLEAR SRS: 1
CSEPEDS: 0 s
CSEPPHR: 142 {beats}/min
Estimated workload: 7 METS
Exercise duration (min): 11 min
LHR: 0.3
LV dias vol: 48 mL (ref 46–106)
LVSYSVOL: 13 mL
NUC STRESS TID: 0.74
Percent HR: 95 %
Rest HR: 67 {beats}/min
SDS: 3
SSS: 4

## 2016-05-26 MED ORDER — TECHNETIUM TC 99M TETROFOSMIN IV KIT
10.7000 | PACK | Freq: Once | INTRAVENOUS | Status: AC | PRN
  Administered 2016-05-26: 11 via INTRAVENOUS
  Filled 2016-05-26: qty 11

## 2016-05-26 MED ORDER — TECHNETIUM TC 99M TETROFOSMIN IV KIT
32.0000 | PACK | Freq: Once | INTRAVENOUS | Status: AC | PRN
  Administered 2016-05-26: 32 via INTRAVENOUS
  Filled 2016-05-26: qty 32

## 2017-08-05 IMAGING — NM NM MISC PROCEDURE
6 series · 36 of 36 positions shown · non-contrast
Comparison: none

[Series 1: wbr_r-proj_st rest · 6.40mm/px · 6 of 64 frames shown]
[frame 6/64]
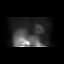
[frame 16/64]
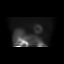
[frame 27/64]
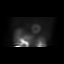
[frame 38/64]
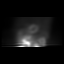
[frame 48/64]
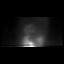
[frame 59/64]
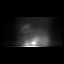

[Series 1: wbr_s-proj_st stress-gsp · 6.40mm/px · 6 of 512 frames shown]
[frame 43/512]
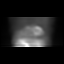
[frame 128/512]
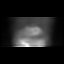
[frame 214/512]
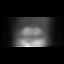
[frame 299/512]
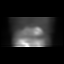
[frame 384/512]
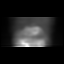
[frame 470/512]
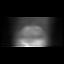

[Series 1: stress-sum-em · 6.40mm/px · 6 of 64 frames shown]
[frame 6/64]
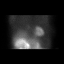
[frame 16/64]
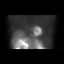
[frame 27/64]
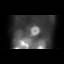
[frame 38/64]
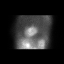
[frame 48/64]
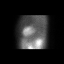
[frame 59/64]
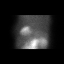

[Series 1: stress-gsp · 6.40mm/px · 6 of 512 frames shown]
[frame 43/512  full-range]
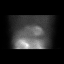
[frame 128/512  full-range]
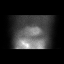
[frame 214/512  full-range]
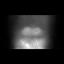
[frame 299/512  full-range]
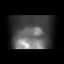
[frame 384/512  full-range]
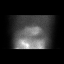
[frame 470/512  full-range]
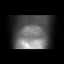

[Series 1: rest · 6.40mm/px · 6 of 64 frames shown]
[frame 6/64]
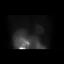
[frame 16/64]
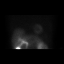
[frame 27/64]
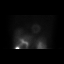
[frame 38/64]
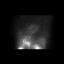
[frame 48/64]
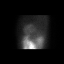
[frame 59/64]
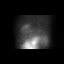

[Series 1: wbr_s-proj_st stress-sum-em · 6.40mm/px · 6 of 64 frames shown]
[frame 6/64]
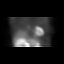
[frame 16/64]
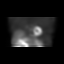
[frame 27/64]
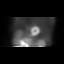
[frame 38/64]
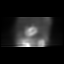
[frame 48/64]
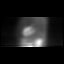
[frame 59/64]
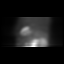

[36 of 36 positions shown; findings below may reference images not displayed]

Canned report from images found in remote index.

Refer to host system for actual result text.
